# Patient Record
Sex: Male | Born: 1980 | Hispanic: Yes | Marital: Married | Smoking: Former smoker
Health system: Southern US, Community
[De-identification: ages and names within clinical notes are randomized; demographics above are authoritative.]

---

## 2020-01-03 ENCOUNTER — Emergency Department (HOSPITAL_COMMUNITY): Payer: Self-pay

## 2020-01-03 ENCOUNTER — Emergency Department (HOSPITAL_COMMUNITY)
Admission: EM | Admit: 2020-01-03 | Discharge: 2020-01-03 | Disposition: A | Discharge status: Home / Self Care | Payer: Self-pay | Attending: Emergency Medicine | Admitting: Emergency Medicine

## 2020-01-03 ENCOUNTER — Ambulatory Visit
Admission: EM | Admit: 2020-01-03 | Discharge: 2020-01-03 | Disposition: A | Discharge status: Home / Self Care | Payer: Self-pay

## 2020-01-03 ENCOUNTER — Encounter (HOSPITAL_COMMUNITY): Payer: Self-pay | Admitting: Emergency Medicine

## 2020-01-03 ENCOUNTER — Other Ambulatory Visit: Payer: Self-pay

## 2020-01-03 DIAGNOSIS — S62660B Nondisplaced fracture of distal phalanx of right index finger, initial encounter for open fracture: Secondary | ICD-10-CM | POA: Insufficient documentation

## 2020-01-03 DIAGNOSIS — S61214A Laceration without foreign body of right ring finger without damage to nail, initial encounter: Secondary | ICD-10-CM | POA: Insufficient documentation

## 2020-01-03 DIAGNOSIS — Y9389 Activity, other specified: Secondary | ICD-10-CM | POA: Insufficient documentation

## 2020-01-03 DIAGNOSIS — Z87891 Personal history of nicotine dependence: Secondary | ICD-10-CM | POA: Insufficient documentation

## 2020-01-03 DIAGNOSIS — Y998 Other external cause status: Secondary | ICD-10-CM | POA: Insufficient documentation

## 2020-01-03 DIAGNOSIS — S61310A Laceration without foreign body of right index finger with damage to nail, initial encounter: Secondary | ICD-10-CM

## 2020-01-03 DIAGNOSIS — W312XXA Contact with powered woodworking and forming machines, initial encounter: Secondary | ICD-10-CM | POA: Insufficient documentation

## 2020-01-03 DIAGNOSIS — Y9289 Other specified places as the place of occurrence of the external cause: Secondary | ICD-10-CM | POA: Insufficient documentation

## 2020-01-03 MED ORDER — LIDOCAINE HCL (PF) 2 % IJ SOLN
5.0000 mL | Freq: Once | INTRAMUSCULAR | Status: AC
  Administered 2020-01-03: 5 mL

## 2020-01-03 MED ORDER — CEPHALEXIN 500 MG PO CAPS
500.0000 mg | ORAL_CAPSULE | Freq: Once | ORAL | Status: AC
  Administered 2020-01-03: 500 mg via ORAL
  Filled 2020-01-03: qty 1

## 2020-01-03 MED ORDER — CEPHALEXIN 500 MG PO CAPS: 500.0000 mg | ORAL_CAPSULE | Freq: Four times a day (QID) | ORAL | 0 refills | Status: AC

## 2020-01-03 MED ORDER — LIDOCAINE HCL (PF) 2 % IJ SOLN
INTRAMUSCULAR | Status: AC
  Filled 2020-01-03: qty 20

## 2020-01-03 MED ORDER — POVIDONE-IODINE 10 % EX SOLN
CUTANEOUS | Status: DC | PRN
  Administered 2020-01-03: 2 via TOPICAL
  Filled 2020-01-03: qty 30

## 2020-01-03 NOTE — ED Triage Notes (Signed)
Table saw injury to all 4 fingers on right hand x 20 min ago

## 2020-01-03 NOTE — Discharge Instructions (Addendum)
Keep your wounds clean, dry and covered.  Change your dressings once a day and inspect your wounds for any signs of infection including redness, increased swelling or drainage of infection.  You will need to have the stitches removed in 7 to 10 days.  Change the yellow dressing from your injured nail bed daily and apply a new yellow dressing until this wound has formed a scab.  Continue to use the finger splint until your primary MD says it is okay to stop protecting this fracture.  Take the entire course of the antibiotics prescribed.

## 2020-01-03 NOTE — ED Notes (Signed)
Pt presents with multiple finger table saw injury. Lacerations cleaned and assessed by Rennis Harding, PA.   Patient is being discharged from the Urgent Care and sent to the Emergency Department via POV . Per [place], patient is in need of higher level of care due to complex lacerations. Patient is aware and verbalizes understanding of plan of care.  Vitals:   01/03/20 1732  BP: (!) 157/100  Pulse: 84  Resp: 16  Temp: 98 F (36.7 C)  SpO2: 97%

## 2020-01-03 NOTE — ED Triage Notes (Signed)
Cut right index and right ring finger on table saw.  Last T-dap 2020.  Denies pain at this time.

## 2020-01-04 NOTE — ED Provider Notes (Signed)
[facility] EMERGENCY DEPARTMENT Provider Note   CSN: [id number] Arrival date & time: 01/03/20  1747     History Chief Complaint  Patient presents with  . Finger Injury    Marc Parks is a 39 y.o. male presenting for evaluation of right finger lacerations occurring prior to arrival at work while using a table saw.  He is right handed, sustaining lacerations on his distal index finger and the ring finger.  He reports moderate bleeding, but now controlled with dressings.  He has no significant pain at this time.  Denies numbness in the injured fingers.  Tetanus is current. The history is provided by the patient.       History reviewed. No pertinent past medical history.  There are no problems to display for this patient.   History reviewed. No pertinent surgical history.     No family history on file.  Social History   Tobacco Use  . Smoking status: Former Games developer  . Smokeless tobacco: Never Used  Substance Use Topics  . Alcohol use: Yes    Alcohol/week: 15.0 standard drinks    Types: 15 Cans of beer per week  . Drug use: Never    Home Medications Prior to Admission medications   Medication Sig Start Date End Date Taking? Authorizing Provider  cephALEXin (KEFLEX) 500 MG capsule Take 1 capsule (500 mg total) by mouth 4 (four) times daily. 01/03/20   Burgess Amor, PA-C    Allergies    Patient has no known allergies.  Review of Systems   Review of Systems  Constitutional: Negative for chills and fever.  Skin: Positive for wound.  Neurological: Negative for weakness and numbness.  All other systems reviewed and are negative.   Physical Exam Updated Vital Signs BP (!) 132/93 (BP Location: Right Arm)   Pulse 71   Temp 98.4 F (36.9 C) (Oral)   Resp 15   Ht 5\' 7"  (1.702 m)   Wt 70.3 kg   SpO2 99%   BMI 24.28 kg/m   Physical Exam Constitutional:      Appearance: He is well-developed.  HENT:     Head: Normocephalic.  Cardiovascular:     Rate and  Rhythm: Normal rate.  Pulmonary:     Effort: Pulmonary effort is normal.  Musculoskeletal:        General: No tenderness.  Skin:    Findings: Laceration present.     Comments: 1.5 cm laceration volar right ring finger with 1 cm laceration distal same finger next to nail plate which is intact. Hemostatic. Round avulsive injury to the right index finger through the distal nail plate and involving the nail bed.  Distal sensation intact both fingers.  Neurological:     Mental Status: He is alert and oriented to person, place, and time.     Sensory: No sensory deficit.     ED Results / Procedures / Treatments   Labs (all labs ordered are listed, but only abnormal results are displayed) Labs Reviewed - No data to display  EKG None  Radiology DG Hand Complete Right  Result Date: 01/03/2020 CLINICAL DATA:  Injured right index and right ring finger on table saw today, lacerations EXAM: RIGHT HAND - COMPLETE 3+ VIEW COMPARISON:  None. FINDINGS: Frontal, oblique, lateral views of the right hand are obtained. There is an open fracture of the distal tuft of the second distal phalanx, minimally displaced. Small laceration is seen distal aspect fourth digit with no underlying bony abnormality. There are no  radiopaque foreign bodies. IMPRESSION: 1. Open fracture distal tuft second distal phalanx. 2. Laceration distal aspect fourth digit with no underlying bony abnormality. Electronically Signed   By: Randa Ngo M.D.   On: 01/03/2020 19:10    Procedures Procedures (including critical care time)  LACERATION REPAIR Performed by: Evalee Jefferson Authorized by: Evalee Jefferson Consent: Verbal consent obtained. Risks and benefits: risks, benefits and alternatives were discussed Consent given by: patient Patient identity confirmed: provided demographic data Prepped and Draped in normal sterile fashion Wound explored  Laceration Location: Right ring finger  Laceration Length: 2 separate laceration, 1.5  cm lac middle phalanx, 1 cm lac distal phalanx at nail plate edge.  The distal nail was broken, no nail bed injury.  No Foreign Bodies seen or palpated  Anesthesia: digital block  Local anesthetic: lidocaine 2% without epinephrine  Anesthetic total: 2 ml  Irrigation method: syringe - saline Amount of cleaning: copious, soaked in betadine and NS  Skin closure: ethilon 4-0  Number of sutures: #3 and #2 resp.  Technique: simple interupted  Patient tolerance: Patient tolerated the procedure well with no immediate complications.  LACERATION REPAIR right index finger Performed by: Evalee Jefferson Authorized by: Evalee Jefferson Consent: Verbal consent obtained. Risks and benefits: risks, benefits and alternatives were discussed Consent given by: patient Patient identity confirmed: provided demographic data Prepped and Draped in normal sterile fashion Wound explored  Laceration Location: distal finger, avulsion through the nail plate and nail bed  Laceration Length: 0.5 cm round avulsion  No Foreign Bodies seen or palpated  Anesthesia: n/a  Local anesthetic:n/a  Anesthetic total: n/a  Irrigation method: syringe Amount of cleaning: copious  Skin closure: not amenable to suture repair.  Wound was cleaned by soaking in betadine and NS.  Xeroform dressing, cling.   Number of sutures: n/a  Technique: n/a  Patient tolerance: Patient tolerated the procedure well with no immediate complications.    Medications Ordered in ED Medications  lidocaine HCl (PF) (XYLOCAINE) 2 % injection 5 mL (5 mLs Other Given 01/03/20 2049)  lidocaine HCl (PF) (XYLOCAINE) 2 % injection (  Given 01/03/20 2050)  cephALEXin (KEFLEX) capsule 500 mg (500 mg Oral Given 01/03/20 2347)    ED Course  I have reviewed the triage vital signs and the nursing notes.  Pertinent labs & imaging results that were available during my care of the patient were reviewed by me and considered in my medical decision making (see  chart for details).    MDM Rules/Calculators/A&P                      Pt with simple laceration to the ring finger,  Tuft fracture with avulsion through the nail plate and nail bed right index finger.  He was given a finger splint to protect this injury after dressing.  He is current with tetanus.  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 Advised recheck by pcp within 1 week.  Daily dressing changes.  Discussed significance of open fracture.  Placed on keflex, first dose given here.  Also gave pt cd disk with xrays for pcp.  Final Clinical Impression(s) / ED Diagnoses Final diagnoses:  Laceration of right index finger without foreign body with damage to nail, initial encounter  Laceration of right ring finger without foreign body without damage to nail, initial encounter  Open nondisplaced fracture of distal phalanx of right index finger, initial encounter    Rx / DC Orders ED Discharge Orders  Ordered    cephALEXin (KEFLEX) 500 MG capsule  4 times daily     01/03/20 2239           Burgess Amor, Cordelia Poche 01/04/20 1518    Vanetta Mulders, MD 01/16/20 [phone]

## 2021-09-01 IMAGING — DX DG HAND COMPLETE 3+V*R*
3 series · 3 of 3 positions shown · non-contrast
Comparison: None.

CLINICAL DATA: Injured right index and right ring finger on table
saw today, lacerations

EXAM:
RIGHT HAND - COMPLETE 3+ VIEW

[hand pa]
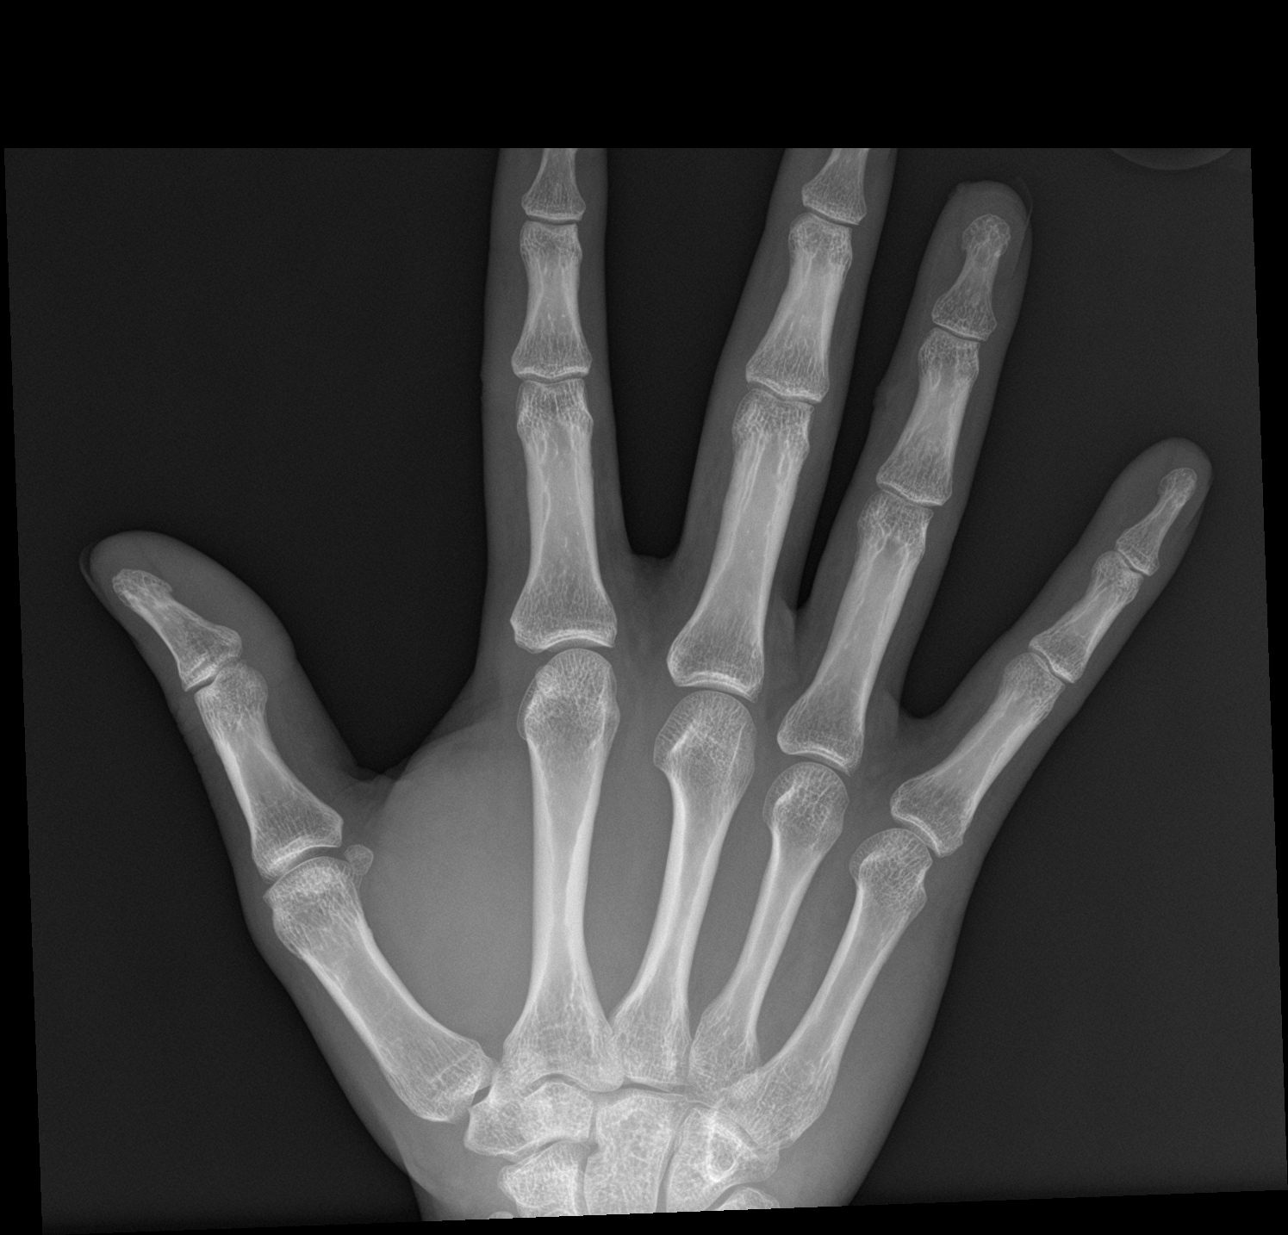

[hand obl]
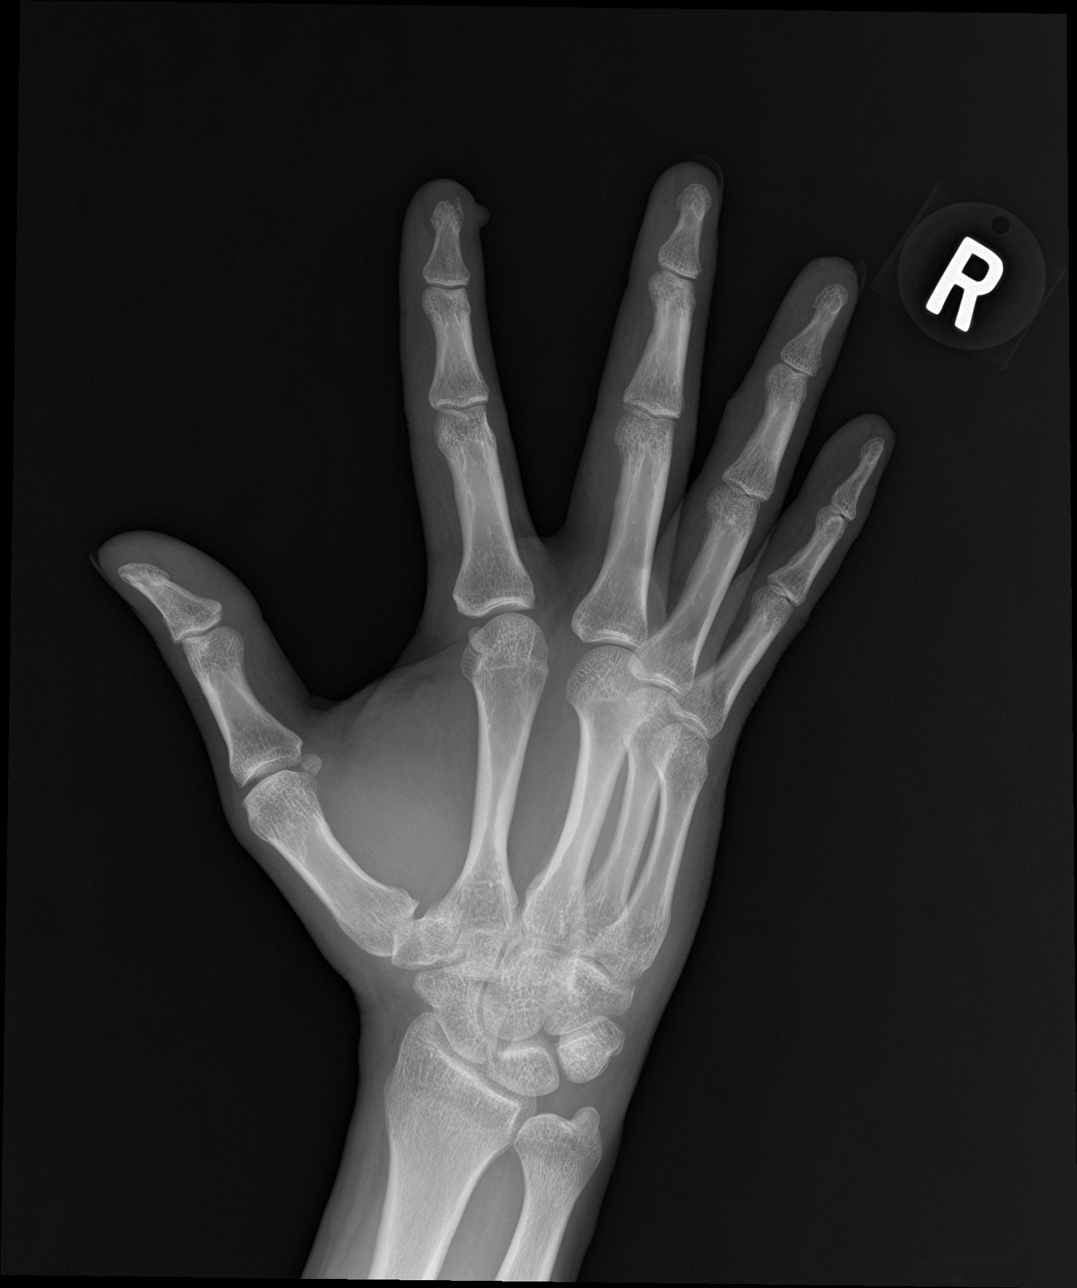

[hand lat]
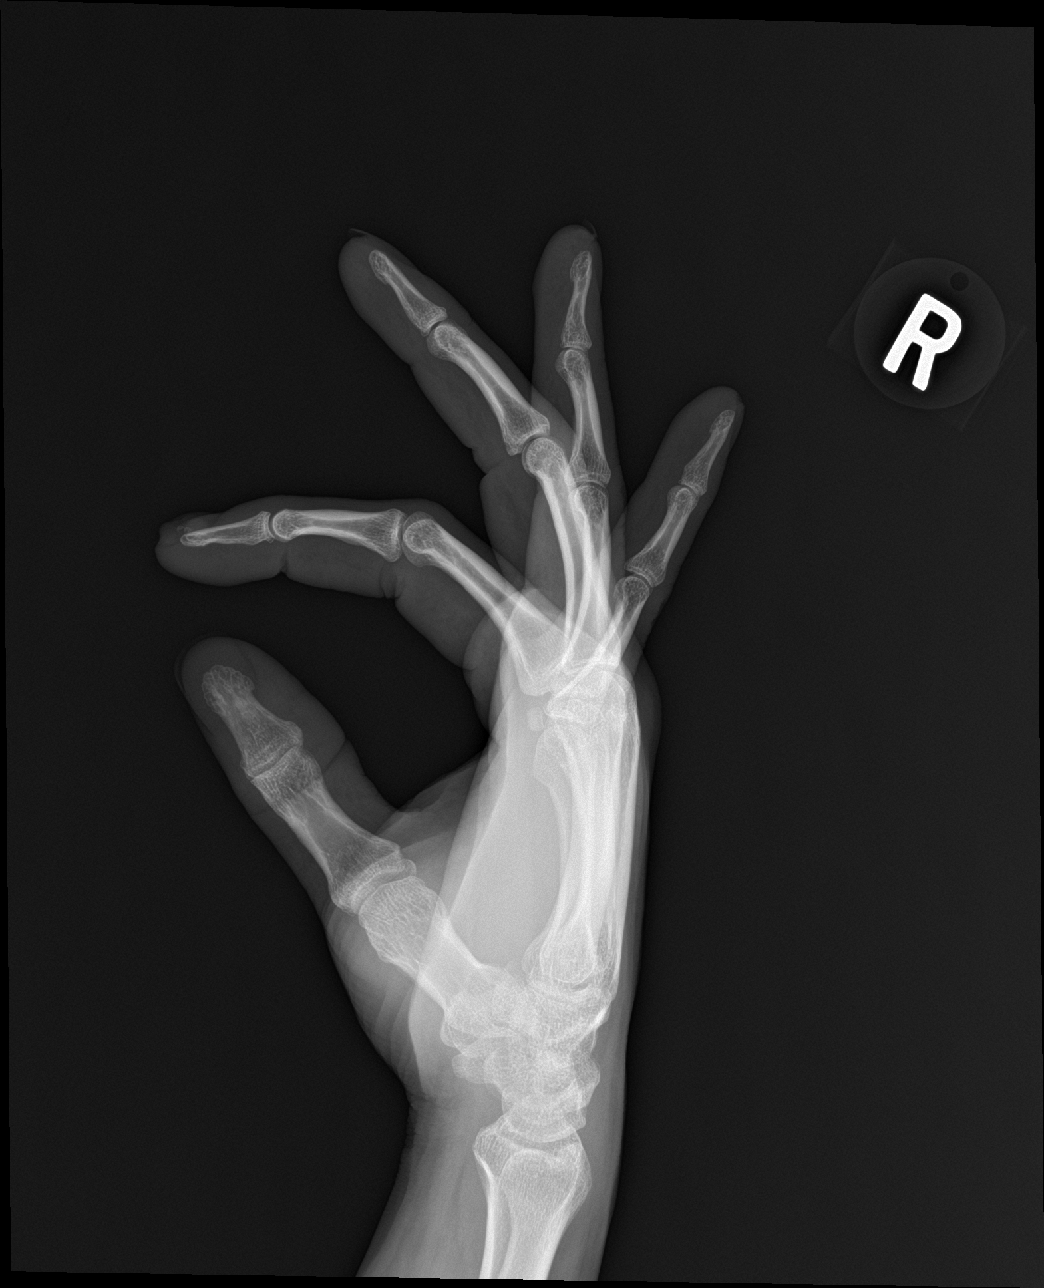

[3 of 3 positions shown; findings below may reference images not displayed]

FINDINGS: Frontal, oblique, lateral views of the right hand are obtained.
There is an open fracture of the distal tuft of the second distal
phalanx, minimally displaced. Small laceration is seen distal aspect
fourth digit with no underlying bony abnormality. There are no
radiopaque foreign bodies.
IMPRESSION: 1. Open fracture distal tuft second distal phalanx.
2. Laceration distal aspect fourth digit with no underlying bony
abnormality.
